# Patient Record
Sex: Male | Born: 2000 | Race: White | Hispanic: Yes | Marital: Single | Smoking: Never smoker
Health system: Southern US, Community
[De-identification: ages and names within clinical notes are randomized; demographics above are authoritative.]

## PROBLEM LIST (undated history)

## (undated) DIAGNOSIS — M419 Scoliosis, unspecified: Secondary | ICD-10-CM

## (undated) DIAGNOSIS — G7100 Muscular dystrophy, unspecified: Secondary | ICD-10-CM

## (undated) DIAGNOSIS — T7840XA Allergy, unspecified, initial encounter: Secondary | ICD-10-CM

## (undated) DIAGNOSIS — G4730 Sleep apnea, unspecified: Secondary | ICD-10-CM

## (undated) DIAGNOSIS — K649 Unspecified hemorrhoids: Secondary | ICD-10-CM

## (undated) DIAGNOSIS — N189 Chronic kidney disease, unspecified: Secondary | ICD-10-CM

## (undated) HISTORY — DX: Chronic kidney disease, unspecified: N18.9

## (undated) HISTORY — DX: Sleep apnea, unspecified: G47.30

## (undated) HISTORY — PX: TONSILLECTOMY: SHX5217

## (undated) HISTORY — DX: Allergy, unspecified, initial encounter: T78.40XA

## (undated) HISTORY — PX: FOOT SURGERY: SHX648

## (undated) HISTORY — DX: Scoliosis, unspecified: M41.9

---
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
## 2023-04-09 NOTE — Progress Notes (Unsigned)
New patient visit   Patient: Albert Young   DOB: [birth year]   22 y.o. Male  MRN: [id number] Visit Date: 04/12/2023  Today's healthcare provider: Ronnald Ramp, MD   No chief complaint on file.  Subjective    Albert Young is a 22 y.o. male who presents today as a new patient to establish care.  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   No family history on file. Social History   Socioeconomic History   Marital status: Single    Spouse name: Not on file   Number of children: Not on file   Years of education: Not on file   Highest education level: Not on file  Occupational History   Not on file  Tobacco Use   Smoking status: Never   Smokeless tobacco: Never  Vaping Use   Vaping Use: Never used  Substance and Sexual Activity   Alcohol use: No   Drug use: No   Sexual activity: Not on file  Other Topics Concern   Not on file  Social History Narrative   Not on file   Social Determinants of Health   Financial Resource Strain: Not on file  Food Insecurity: Not on file  Transportation Needs: Not on file  Physical Activity: Not on file  Stress: Not on file  Social Connections: Not on file    Outpatient Medications Prior to Visit  Medication Sig   cyclobenzaprine (FLEXERIL) 5 MG tablet Take 1 tablet (5 mg total) by mouth 3 (three) times daily as needed for muscle spasms.   ibuprofen (ADVIL,MOTRIN) 600 MG tablet Take 1 tablet (600 mg total) by mouth every 6 (six) hours as needed for mild pain or moderate pain.   ondansetron (ZOFRAN) 4 MG tablet Take 1 tablet (4 mg total) by mouth daily as needed for nausea or vomiting. (Patient not taking: Reported on 04/22/2017)   No facility-administered medications prior to visit.   Allergies  Allergen Reactions   Penicillins Rash     There is no immunization history on file for this patient.  Health Maintenance  Topic Date Due   COVID-19 Vaccine (1) Never done   HPV VACCINES (1 - Male 2-dose series) Never done   HIV Screening  Never done   Hepatitis C Screening  Never done   DTaP/Tdap/Td (1 - Tdap) Never done   INFLUENZA VACCINE  07/29/2023    Patient Care Team: Dorann Lodge, MD as PCP - General (Pediatrics)  Review of Systems  {Labs  Heme  Chem  Endocrine  Serology  Results Review (optional):23779}   Objective    There were no vitals taken for this visit. {Show previous vital signs (optional):23777}  Physical Exam ***  Depression Screen     No data to display         No results found for any visits on 04/12/23.  Assessment & Plan      Problem List Items Addressed This Visit   None    No follow-ups on file.      The entirety of the information documented in the History of Present Illness, Review of Systems and Physical Exam were personally obtained by me. Portions of this information were initially documented by *** . I, Ronnald Ramp, MD have reviewed the documentation above for thoroughness and accuracy.      Ronnald Ramp, MD  [facility] [phone] (phone) [phone] (fax)  [facility]

## 2023-04-12 ENCOUNTER — Encounter: Payer: Self-pay | Admitting: Family Medicine

## 2023-04-12 ENCOUNTER — Ambulatory Visit (INDEPENDENT_AMBULATORY_CARE_PROVIDER_SITE_OTHER): Payer: Medicaid Other | Admitting: Family Medicine

## 2023-04-12 VITALS — BP 121/71 | HR 88 | Resp 16 | Ht 68.0 in

## 2023-04-12 DIAGNOSIS — G4730 Sleep apnea, unspecified: Secondary | ICD-10-CM | POA: Diagnosis not present

## 2023-04-12 DIAGNOSIS — G7101 Duchenne or Becker muscular dystrophy: Secondary | ICD-10-CM | POA: Insufficient documentation

## 2023-04-12 DIAGNOSIS — L700 Acne vulgaris: Secondary | ICD-10-CM | POA: Insufficient documentation

## 2023-04-12 DIAGNOSIS — H547 Unspecified visual loss: Secondary | ICD-10-CM

## 2023-04-12 DIAGNOSIS — Z7689 Persons encountering health services in other specified circumstances: Secondary | ICD-10-CM | POA: Insufficient documentation

## 2023-04-12 DIAGNOSIS — Z973 Presence of spectacles and contact lenses: Secondary | ICD-10-CM

## 2023-04-12 MED ORDER — ADAPALENE 0.1 % EX CREA: TOPICAL_CREAM | Freq: Every day | CUTANEOUS | 2 refills | Status: AC

## 2023-04-12 NOTE — Patient Instructions (Signed)
It was a pleasure meeting you today!  Welcome to [facility].  I look forward to taking part in your care as your new primary care physician.    Summary of our discussion today:   I have prescribed a cream to apply once daily at bed time to the forehead. This medication can cause some drying and redness of the skin so try to avoid being in the sun for long periods of time as this can make it worse A referral has been placed on your behalf for ophthalmology. Our referral coordination team or the office you will be visiting will contact you within the next 2 weeks. If you have not received a phone call within 10 business days please let us know so that we can check into this for you.   Please remember to schedule your annual physical one year from your last physical.   You should return to our clinic in 4 weeks for acne follow up.   Best Wishes,   Dr. Roxan Hockey

## 2023-04-12 NOTE — Assessment & Plan Note (Signed)
Prescribed adalapene to apply once daily at bed time  Counseled to avoid sun exposure and wear a hat when outside, patient voiced understanding

## 2023-04-12 NOTE — Assessment & Plan Note (Addendum)
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

## 2023-04-12 NOTE — Assessment & Plan Note (Signed)
Chronic  Stable  Reports nightly use of BiPap  Followed by pulmonology, Dr. Audrea Muscat

## 2023-04-12 NOTE — Assessment & Plan Note (Signed)
Welcomed patient to [facility]  Reviewed patient's medical history, medications, surgical and social history Discussed roles and expectations for primary care physician-patient relationship Recommended patient schedule annual preventative examinations   

## 2023-05-13 ENCOUNTER — Encounter: Payer: Self-pay | Admitting: Family Medicine

## 2023-05-13 ENCOUNTER — Ambulatory Visit (INDEPENDENT_AMBULATORY_CARE_PROVIDER_SITE_OTHER): Payer: Medicaid Other | Admitting: Family Medicine

## 2023-05-13 VITALS — BP 108/70 | HR 76 | Resp 16

## 2023-05-13 DIAGNOSIS — L700 Acne vulgaris: Secondary | ICD-10-CM | POA: Diagnosis not present

## 2023-05-13 DIAGNOSIS — R059 Cough, unspecified: Secondary | ICD-10-CM | POA: Insufficient documentation

## 2023-05-13 MED ORDER — BENZONATATE 100 MG PO CAPS: 100.0000 mg | ORAL_CAPSULE | Freq: Two times a day (BID) | ORAL | 0 refills | Status: AC | PRN

## 2023-05-13 NOTE — Assessment & Plan Note (Signed)
Acute  Productive cough that is reportedly improving, considered this as SE of lisinopril but less likely given nature of cough as described  Will have patient drink warm team with honey and use PRN tessalon perles 100mg  twice daily  RTC if symptoms are not improved after 10 days, no fever or chills nor SOB reported today

## 2023-05-13 NOTE — Progress Notes (Signed)
I,Albert Young,acting as a scribe for [employer], MD.,have documented all relevant documentation on the behalf of Albert Ramp, MD,as directed by  Albert Ramp, MD while in the presence of Albert Ramp, MD.   Established patient visit   Patient: Albert Young   DOB: [birth year]   22 y.o. Male  MRN: [id number] Visit Date: 05/13/2023  Today's healthcare provider: Ronnald Ramp, MD   Chief Complaint  Patient presents with   Follow-Up Acne   Subjective    HPI   Acne  Patient presents for follow up evaluation of acne. Symptoms have gradually improved.  Acne is primarily located on the forehead. The patient also describes improvement from last office visit with a cream his mom is using. Treatment to date has included RetinA: patient not using . Mother is using Barmicil 1%.   Cough  Reports 1 week of cough denies SOB or chest pain  Reports has become less frquent  Productive of clear sputum  Medications: Outpatient Medications Prior to Visit  Medication Sig   adapalene (DIFFERIN) 0.1 % cream Apply topically at bedtime.   carvedilol (COREG) 3.125 MG tablet Take 3.125 mg by mouth 2 (two) times daily with a meal.   cyclobenzaprine (FLEXERIL) 5 MG tablet Take 1 tablet (5 mg total) by mouth 3 (three) times daily as needed for muscle spasms.   ibuprofen (ADVIL,MOTRIN) 600 MG tablet Take 1 tablet (600 mg total) by mouth every 6 (six) hours as needed for mild pain or moderate pain.   lisinopril (ZESTRIL) 10 MG tablet Take 10 mg by mouth daily.   ondansetron (ZOFRAN) 4 MG tablet Take 1 tablet (4 mg total) by mouth daily as needed for nausea or vomiting.   Vitamin D, Ergocalciferol, (DRISDOL) 1.25 MG (50000 UNIT) CAPS capsule Take 50,000 Units by mouth every 7 (seven) days.   No facility-administered medications prior to visit.    Review of Systems      Objective    BP 108/70 (BP Location: Right Arm, Patient  Position: Sitting, Cuff Size: Normal)   Pulse 76   Resp 16     Physical Exam Vitals reviewed.  Constitutional:      General: He is not in acute distress.    Appearance: Normal appearance. He is not ill-appearing, toxic-appearing or diaphoretic.     Comments: Well appearing male seated in electric wheelchair, in NAD on RA  Eyes:     Conjunctiva/sclera: Conjunctivae normal.  Cardiovascular:     Rate and Rhythm: Normal rate and regular rhythm.     Pulses: Normal pulses.     Heart sounds: Normal heart sounds. No murmur heard.    No friction rub. No gallop.  Pulmonary:     Effort: Pulmonary effort is normal. No accessory muscle usage, prolonged expiration or respiratory distress.     Breath sounds: Normal breath sounds. No stridor, decreased air movement or transmitted upper airway sounds. No decreased breath sounds, wheezing, rhonchi or rales.  Abdominal:     General: Bowel sounds are normal. There is no distension.     Palpations: Abdomen is soft.     Tenderness: There is no abdominal tenderness.  Musculoskeletal:     Right lower leg: No edema.     Left lower leg: No edema.     Comments: Bilateral hand contractures   Skin:    Findings: No erythema or rash.  Neurological:     Mental Status: He is alert and oriented to person, place, and time.  No results found for any visits on 05/13/23.  Assessment & Plan     Problem List Items Addressed This Visit       Musculoskeletal and Integument   Acne vulgaris    Chronic  Improved per patient  Recommended against daily use of steroid creams on the face to prevent hypopigmentation over time, both patient and his mother voiced understanding  Recommended starting adapalene 1% use at bedtime and wearing a hat when outside to protect against hypersensitivity to the sun         Other   Cough in adult - Primary    Acute  Productive cough that is reportedly improving, considered this as SE of lisinopril but less likely given  nature of cough as described  Will have patient drink warm team with honey and use PRN tessalon perles 100mg  twice daily  RTC if symptoms are not improved after 10 days, no fever or chills nor SOB reported today        Relevant Medications   benzonatate (TESSALON) 100 MG capsule     Return in about 4 months (around 09/13/2023) for CPE.        The entirety of the information documented in the History of Present Illness, Review of Systems and Physical Exam were personally obtained by me. Portions of this information were initially documented by Hetty Ely, CMA . I, Albert Ramp, MD have reviewed the documentation above for thoroughness and accuracy.      Albert Ramp, MD  [facility] [phone] (phone) [phone] (fax)  [facility]

## 2023-05-13 NOTE — Patient Instructions (Addendum)
   [facility] [place] 05/26/2023 01:20 PM Dr. Viviann Spare Dingeldein   Please use the topical therapy previously prescribed daily at bed time and make sure to wear a hat for sun protection when you will be outside on sunny days    I have prescribed a medication to use up to twice daily as needed for cough   Please drink warm teas with honey to help with your cough as well   Please return to care if symptoms are not improved after 10 days    Please follow up with me in 4 months for a physical

## 2023-05-13 NOTE — Assessment & Plan Note (Signed)
Chronic  Improved per patient  Recommended against daily use of steroid creams on the face to prevent hypopigmentation over time, both patient and his mother voiced understanding  Recommended starting adapalene 1% use at bedtime and wearing a hat when outside to protect against hypersensitivity to the sun

## 2023-09-13 ENCOUNTER — Ambulatory Visit: Payer: Medicaid Other | Admitting: Family Medicine

## 2023-10-07 ENCOUNTER — Ambulatory Visit: Payer: Medicaid Other | Admitting: Family Medicine

## 2024-06-22 ENCOUNTER — Telehealth: Payer: Self-pay

## 2024-06-22 DIAGNOSIS — G7101 Duchenne or Becker muscular dystrophy: Secondary | ICD-10-CM

## 2024-06-22 NOTE — Telephone Encounter (Signed)
 Order has been updated.

## 2024-06-22 NOTE — Telephone Encounter (Signed)
 Copied from CRM [id number]. Topic: General - Other >> Jun 22, 2024 11:37 AM Avram MATSU wrote: Reason for CRM: Debby Arloa is calling from [facility] because patient is too old for them to start seeing him and he needs to be referred to adult cardiology.

## 2024-08-22 ENCOUNTER — Ambulatory Visit: Payer: Self-pay

## 2024-08-22 ENCOUNTER — Telehealth: Payer: Self-pay | Admitting: Family Medicine

## 2024-08-22 NOTE — Telephone Encounter (Signed)
 FYI Only or Action Required?: FYI only for provider.  Patient was last seen in primary care on 05/13/2023 by Sharma Coyer, MD.  Called Nurse Triage reporting Advice Only.   Triage Disposition: Information or Advice Only Call  Patient/caregiver understands and will follow disposition?: Yes  Patient's mother called asking about a mask that was given to him at the ER . She says it will not inflate properly.  So he can't use it and she says it gets the phlegm out   CB#  [phone]    Reason for Disposition  Health information question, no triage required and triager able to answer question  Answer Assessment - Initial Assessment Questions 1. REASON FOR CALL: What is the main reason for your call? or How can I best help you?     Mom is calling about a cough assistance device that was received from the pulmonary office. Mom states the device isn't working correctly. Recommended to call pulmonary office to get assistance with the device. Mother verbalized understanding and all questions answered.  Protocols used: Information Only Call - No Triage-A-AH

## 2024-08-22 NOTE — ED Notes (Signed)
 [facility] Emergency Department Attending Attestation Note   ED Clinical Impression   Final diagnoses:  Dizziness (Primary)  Hemorrhoids, unspecified hemorrhoid type  Chest pain, unspecified type  Duchenne muscular dystrophy         ED Attending Physician Teaching Attestation  MDM/ED course:  I supervised care provided by the resident. We have discussed the case, I have reviewed the note and I agree with the plan of treatment with exceptions as documented in my note below. I have personally performed a face-to-face diagnostic evaluation on this patient.   ED Attending Note   ED Triage Vitals  Enc Vitals Group     BP 08/20/24 1558 120/87     Pulse 08/20/24 1556 95     SpO2 Pulse 08/20/24 1900 99     Resp 08/20/24 1558 14     Temp 08/20/24 1558 36.8 C (98.2 F)     Temp Source 08/20/24 2012 Oral     SpO2 08/20/24 1556 95 %     Weight --      Height --      Head Circumference --      Peak Flow --      Pain Score --      Pain Loc --      Pain Education --      Exclude from Growth Chart --      Imaging reviewed by me:  XR Chest 2 views  Final Result    No acute cardiopulmonary abnormalities.             Dispo:  The patient was discharged home in stable condition. The patient will follow up with his primary provider.   See chart and resident provider documentation for details.  Additional Medical Decision Making  I have reviewed the vital signs and the nursing notes. Labs and radiology results that were available during my care of the patient were independently reviewed by me and considered in my medical decision making.   I reviewed the patient's prior medical records. I independently visualized the EKG tracing  I independently visualized the radiology images.  I supervised the following procedure performed by the resident: I was available throughout the procedure and I was present for the key portions of the procedure.

## 2024-08-22 NOTE — Telephone Encounter (Unsigned)
 Copied from CRM [id number]. Topic: Clinical - Pink Word Triage >> Aug 22, 2024  2:44 PM Leonette P wrote: Patient's mother called asking about a mask that was given to him at the ER . She says it will not inflate properly.  So he can't use it and she says it gets the phlegm out  CB#  [phone]

## 2024-08-23 NOTE — Telephone Encounter (Signed)
 This may need to be addressed with [facility] pulm specialist.

## 2024-08-23 NOTE — Telephone Encounter (Signed)
 Contact patient using language line [phone]) agent Lidani (id [id number]) assisted with phone call. Patient mother was advised and verbalized understanding

## 2024-08-24 ENCOUNTER — Encounter: Payer: Self-pay | Admitting: Physician Assistant

## 2024-08-24 ENCOUNTER — Ambulatory Visit (INDEPENDENT_AMBULATORY_CARE_PROVIDER_SITE_OTHER): Admitting: Physician Assistant

## 2024-08-24 VITALS — BP 118/67 | HR 124 | Ht 68.0 in | Wt 170.0 lb

## 2024-08-24 DIAGNOSIS — R000 Tachycardia, unspecified: Secondary | ICD-10-CM | POA: Diagnosis not present

## 2024-08-24 DIAGNOSIS — R630 Anorexia: Secondary | ICD-10-CM | POA: Diagnosis not present

## 2024-08-24 DIAGNOSIS — G7101 Duchenne or Becker muscular dystrophy: Secondary | ICD-10-CM

## 2024-08-24 DIAGNOSIS — R0789 Other chest pain: Secondary | ICD-10-CM | POA: Diagnosis not present

## 2024-08-24 DIAGNOSIS — R42 Dizziness and giddiness: Secondary | ICD-10-CM

## 2024-08-24 NOTE — Progress Notes (Signed)
 Established patient visit  Patient: Albert Young   DOB: September 25, 2001   23 y.o. Male  MRN: [id number] Visit Date: 08/24/2024  Today's healthcare provider: Jolynn Spencer, PA-C   Chief Complaint  Patient presents with   Follow-up    ER follow up from 8/24. Seen for dizziness Patient reports that when he is sitting to shower or use the restroom he feels dizzy, has headache. States he will sometimes have chest heaviness and some SOB.  States that mom will elevate his legs and it seems to help.    Subjective     HPI     Follow-up    Additional comments: ER follow up from 8/24. Seen for dizziness Patient reports that when he is sitting to shower or use the restroom he feels dizzy, has headache. States he will sometimes have chest heaviness and some SOB.  States that mom will elevate his legs and it seems to help.       Last edited by Cherry Chiquita HERO, CMA on 08/24/2024  3:19 PM.       Discussed the use of AI scribe software for clinical note transcription with the patient, who gave verbal consent to proceed.  History of Present Illness Albert Young is a 23 year old male who presents with dizziness and chest pressure.  Dizziness occurs primarily when showering, sitting in a chair, or using the bathroom, starting five days ago. It is accompanied by a headache and chest pressure, causing breathing difficulty. He feels fine in his wheelchair. Chest pressure is most pronounced in the morning when sitting in his bath chair and is relieved by elevating his legs.  He was seen in the emergency room where chest x-ray and EKG were normal. He was advised to drink plenty of water but reports no improvement. He has a history of fainting, which prompted the emergency room visit.  He has a poor appetite and was previously on medication for kidney stones, which could cause low blood pressure, though his blood pressure was normal in the emergency room.  No urinary tract infection, issues  with urination, weakness, tingling, numbness, dizziness with position changes, or current chest pain or shortness of breath.  Dizziness (Primary)  Hemorrhoids, unspecified hemorrhoid type  Chest pain, unspecified type  Duchenne muscular dystrophy  Albert Young was seen today in the emergency department for evaluation of chest pressure, headache, dizziness and hemorrhoids. His workup here was reassuring, he continues to have blood in his urine from his kidney stones. His chest x-ray did not look like there was any infection. His heart enzyme was normal, which is a good thing. As we discussed please call your primary care provider tomorrow to make an appointment for close follow-up. If his symptoms come back or get worse or he passes out or the chest pain gets bad and does not go away or he starts having a lot of bleeding from his rectum or any other concerning symptom please come back to the emergency department immediately. Otherwise I will prescribe you cream for the hemorrhoids.  Also keep well hydrated!   It was so great to meet you, and happy birthd     08/24/2024    3:20 PM 04/12/2023    1:31 PM 04/12/2023    1:30 PM  Depression screen PHQ 2/9  Decreased Interest 0 0 0  Down, Depressed, Hopeless 1 0 0  PHQ - 2 Score 1 0 0  Altered sleeping 0 0   Tired, decreased energy 1 0  Change in appetite 3 0   Feeling bad or failure about yourself  1 0   Trouble concentrating 0 0   Moving slowly or fidgety/restless 1 0   Suicidal thoughts 0 0   PHQ-9 Score 7 0   Difficult doing work/chores Somewhat difficult Not difficult at all       08/24/2024    3:20 PM  GAD 7 : Generalized Anxiety Score  Nervous, Anxious, on Edge 1  Control/stop worrying 1  Worry too much - different things 2  Trouble relaxing 1  Restless 1  Easily annoyed or irritable 1  Afraid - awful might happen 1  Total GAD 7 Score 8  Anxiety Difficulty Somewhat difficult    Medications: Outpatient Medications Prior to Visit   Medication Sig   adapalene  (DIFFERIN ) 0.1 % cream Apply topically at bedtime.   benzonatate  (TESSALON ) 100 MG capsule Take 1 capsule (100 mg total) by mouth 2 (two) times daily as needed for cough.   carvedilol (COREG) 3.125 MG tablet Take 3.125 mg by mouth 2 (two) times daily with a meal.   cyclobenzaprine  (FLEXERIL ) 5 MG tablet Take 1 tablet (5 mg total) by mouth 3 (three) times daily as needed for muscle spasms.   ibuprofen  (ADVIL ,MOTRIN ) 600 MG tablet Take 1 tablet (600 mg total) by mouth every 6 (six) hours as needed for mild pain or moderate pain.   lisinopril (ZESTRIL) 10 MG tablet Take 10 mg by mouth daily.   ondansetron  (ZOFRAN ) 4 MG tablet Take 1 tablet (4 mg total) by mouth daily as needed for nausea or vomiting.   Vitamin D, Ergocalciferol, (DRISDOL) 1.25 MG (50000 UNIT) CAPS capsule Take 50,000 Units by mouth every 7 (seven) days.   No facility-administered medications prior to visit.    Review of Systems  All other systems reviewed and are negative.  All negative Except see HPI       Objective    BP 118/67 (BP Location: Right Arm, Patient Position: Sitting, Cuff Size: Normal)   Pulse (!) 124   Ht 5' 8 (1.727 m)   Wt 170 lb (77.1 kg)   SpO2 100%   BMI 25.85 kg/m     Physical Exam Vitals reviewed.  Constitutional:      General: He is not in acute distress.    Appearance: Normal appearance. He is not diaphoretic.  HENT:     Head: Normocephalic and atraumatic.  Eyes:     General: No scleral icterus.    Conjunctiva/sclera: Conjunctivae normal.  Cardiovascular:     Rate and Rhythm: Normal rate and regular rhythm.     Pulses: Normal pulses.     Heart sounds: Normal heart sounds. No murmur heard. Pulmonary:     Effort: Pulmonary effort is normal. No respiratory distress.     Breath sounds: Normal breath sounds. No wheezing or rhonchi.  Musculoskeletal:     Cervical back: Neck supple.     Right lower leg: No edema.     Left lower leg: No edema.   Lymphadenopathy:     Cervical: No cervical adenopathy.  Skin:    General: Skin is warm and dry.     Findings: No rash.  Neurological:     Mental Status: He is alert and oriented to person, place, and time. Mental status is at baseline.  Psychiatric:        Mood and Affect: Mood normal.        Behavior: Behavior normal.      No results  found for any visits on 08/24/24.      Assessment & Plan Dizziness associated with postural changes Dizziness with postural changes, likely due to orthostatic hypotension. Previous evaluations normal. Reviewed labwork from 08/20/24, 07/30/24 - Perform orthostatic blood pressure measurements, see vitals. - Refer to cardiologist.  Tachycardia Elevated heart rate at 124 bpm, possibly related to postural changes or cardiac issues. Previous evaluations normal. - Recheck heart rate. - Perform EKG if heart rate remains elevated. - Refer to cardiologist. Will follow-up  Chest pressure with exertion Chest pressure during exertion, absent at rest. Previous evaluations normal. EKG performed - Refer to cardiologist.  Poor appetite Reports poor appetite with no identified cause. - Encourage adequate hydration and nutrition.  Dizziness (Primary)  - EKG 12-Lead  Tachycardia  - EKG 12-Lead  Chest pressure  - EKG 12-Lead  Muscular dystrophy, Duchenne (HCC) Chronic and stable Managed by neurology  No orders of the defined types were placed in this encounter.   No follow-ups on file.   The patient was advised to call back or seek an in-person evaluation if the symptoms worsen or if the condition fails to improve as anticipated.  I discussed the assessment and treatment plan with the patient. The patient was provided an opportunity to ask questions and all were answered. The patient agreed with the plan and demonstrated an understanding of the instructions.  I, Laveyah Oriol, PA-C have reviewed all documentation for this visit. The  documentation on 08/24/2024  for the exam, diagnosis, procedures, and orders are all accurate and complete.  Jolynn Spencer, [facility], MMS [facility] [phone] (phone) [phone] (fax)  [facility]

## 2024-08-25 NOTE — Telephone Encounter (Signed)
-----   Message from Comer Shearer, MD sent at 08/24/2024  3:47 PM EDT ----- Regarding: RE: [facility] patient - assistance with DME equipment (and getting plugged back in for follow up) Thanks Asberry! I'm also in process of getting him transitioned to the adult PM&R clinic - I'll email you the form   Best, Alex ----- Message ----- From: Stevens Asberry Norris, MD Sent: 08/24/2024   1:44 PM EDT To: Comer Lorane Shearer, MD; Anahit Che# Subject: RE: [facility] patient - assistance with DME equ#  Marolyn - I can sign DME paperwork if needed before he comes back for return visit.  Neuro schedulers - please schedule this patient for return visit in interdisciplinary [facility] clinic with Dr. Gregg Maeola Asberry ----- Message ----- From: Shearer Comer Lorane, MD Sent: 08/23/2024   5:16 PM EDT To: Asberry Norris Stevens, MD Subject: [facility] patient - assistance with DME equipme#  Hi Asberry -  We received some wheelchair DME equipment paperwork in the pulmonary clinic where we follow Albert Young - and wanted to see if you'd be willing to review and sign it on his behalf as we're not really familiar with the wheelchair/DME needs on our end. I'd be happy to securely email it to you or a nurse/care coordinator.   I also see he's overdue to get back in with neuro clinic - do you all need a new referral for him or is there any way I can facilitate that? I know it's sometimes hard for mom to navigate all of the phone trees and getting in touch with all of the different clinics between [facility] and his siblings' sub specialists  Thanks so much for your help in this matter!  Best, Marolyn Shearer

## 2024-08-29 DIAGNOSIS — R000 Tachycardia, unspecified: Secondary | ICD-10-CM | POA: Insufficient documentation

## 2024-08-29 DIAGNOSIS — R630 Anorexia: Secondary | ICD-10-CM | POA: Insufficient documentation

## 2024-08-29 DIAGNOSIS — R0789 Other chest pain: Secondary | ICD-10-CM | POA: Insufficient documentation

## 2024-08-29 DIAGNOSIS — R42 Dizziness and giddiness: Secondary | ICD-10-CM | POA: Insufficient documentation

## 2024-10-19 ENCOUNTER — Encounter: Admitting: Family Medicine

## 2024-10-24 ENCOUNTER — Ambulatory Visit (INDEPENDENT_AMBULATORY_CARE_PROVIDER_SITE_OTHER): Admitting: Family Medicine

## 2024-10-24 ENCOUNTER — Encounter: Payer: Self-pay | Admitting: Family Medicine

## 2024-10-24 VITALS — BP 97/66 | HR 92 | Temp 98.4°F | Ht 68.0 in | Wt 170.0 lb

## 2024-10-24 DIAGNOSIS — R630 Anorexia: Secondary | ICD-10-CM

## 2024-10-24 DIAGNOSIS — R42 Dizziness and giddiness: Secondary | ICD-10-CM | POA: Diagnosis not present

## 2024-10-24 DIAGNOSIS — K625 Hemorrhage of anus and rectum: Secondary | ICD-10-CM | POA: Diagnosis not present

## 2024-10-24 DIAGNOSIS — G7101 Duchenne or Becker muscular dystrophy: Secondary | ICD-10-CM

## 2024-10-24 DIAGNOSIS — Z131 Encounter for screening for diabetes mellitus: Secondary | ICD-10-CM

## 2024-10-24 DIAGNOSIS — R519 Headache, unspecified: Secondary | ICD-10-CM | POA: Diagnosis not present

## 2024-10-24 MED ORDER — MIRTAZAPINE 7.5 MG PO TABS: 7.5000 mg | ORAL_TABLET | Freq: Every day | ORAL | 3 refills | Status: AC

## 2024-10-24 MED ORDER — SUMATRIPTAN SUCCINATE 25 MG PO TABS: 25.0000 mg | ORAL_TABLET | ORAL | 0 refills | Status: AC | PRN

## 2024-10-24 NOTE — Progress Notes (Signed)
 Established patient visit   Patient: Albert Young   DOB: [birth year]   23 y.o. Male  MRN: [id number] Visit Date: 10/24/2024  Today's healthcare provider: Rockie Agent, MD   Chief Complaint  Patient presents with   Dizziness    Patient reports chest pressure, dizziness, headache in the morning time and when moving to shower chair    Hemorrhoids    Patient reports that he has some hemorrhoids and is noticing blood when wiping, no pain but is concerned with internal hemorrhoids   Subjective     HPI     Dizziness    Additional comments: Patient reports chest pressure, dizziness, headache in the morning time and when moving to shower chair         Hemorrhoids    Additional comments: Patient reports that he has some hemorrhoids and is noticing blood when wiping, no pain but is concerned with internal hemorrhoids      Last edited by Cherry Chiquita HERO, CMA on 10/24/2024 11:46 AM.       Discussed the use of AI scribe software for clinical note transcription with the patient, who gave verbal consent to proceed.  History of Present Illness Albert Young is a 23 year old male with muscular dystrophy who presents with chest pressure, dizziness, and headaches.  He experiences chest pressure, dizziness, and headaches primarily in the morning and when moving to his shower chair. The headaches are described as throbbing and located in the forehead, middle, and side, feeling like 'something like, like pushing me, like, like grabbing it.' These symptoms occur daily and are associated with a feeling of difficulty breathing. The symptoms subside when he lies down. He has been evaluated by cardiology, with a recent echocardiogram in September 2025. He reports that the cardiologist told him everything was normal. He is currently on a new medication, 25 mg, but cannot recall the name. He also takes carvedilol 3.125 mg.  He notices blood when wiping, which he  attributes to hemorrhoids. The bleeding is described as 'like pee droplets' and occurs when he uses the bathroom. He is concerned about internal hemorrhoids but does not experience pain. The bleeding occurs when he uses the bathroom.  He mentions a decreased appetite and has been trying to supplement with Ensure. He does not feel hungry and does not have difficulty swallowing. He also experiences chills and cold hands, which he attributes to poor circulation. He denies any issues with eating once he starts.  His blood pressure was recorded at 97/66 today, which he notes is lower than his usual morning reading of 132.     Past Medical History:  Diagnosis Date   Allergy    Chronic kidney disease    Closed fracture of right distal femur 07/27/2015   Hemorrhoids    Muscular dystrophy (HCC)    Scoliosis    Sleep apnea     Medications: Outpatient Medications Prior to Visit  Medication Sig   adapalene  (DIFFERIN ) 0.1 % cream Apply topically at bedtime.   benzonatate  (TESSALON ) 100 MG capsule Take 1 capsule (100 mg total) by mouth 2 (two) times daily as needed for cough.   carvedilol (COREG) 3.125 MG tablet Take 3.125 mg by mouth 2 (two) times daily with a meal.   cyclobenzaprine  (FLEXERIL ) 5 MG tablet Take 1 tablet (5 mg total) by mouth 3 (three) times daily as needed for muscle spasms.   eplerenone (INSPRA) 25 MG tablet Take 25 mg by mouth daily.  ibuprofen  (ADVIL ,MOTRIN ) 600 MG tablet Take 1 tablet (600 mg total) by mouth every 6 (six) hours as needed for mild pain or moderate pain.   lisinopril (ZESTRIL) 10 MG tablet Take 10 mg by mouth daily.   ondansetron  (ZOFRAN ) 4 MG tablet Take 1 tablet (4 mg total) by mouth daily as needed for nausea or vomiting.   Vitamin D, Ergocalciferol, (DRISDOL) 1.25 MG (50000 UNIT) CAPS capsule Take 50,000 Units by mouth every 7 (seven) days.   No facility-administered medications prior to visit.    Review of Systems  Last CBC Lab Results  Component  Value Date   WBC 5.0 01/01/2021   HGB 15.1 01/01/2021   HCT 47.1 01/01/2021   MCV 89.4 01/01/2021   MCH 28.7 01/01/2021   RDW 13.1 01/01/2021   PLT 277 01/01/2021   Last metabolic panel Lab Results  Component Value Date   GLUCOSE 108 (H) 01/01/2021   NA 139 01/01/2021   K 3.8 01/01/2021   CL 99 01/01/2021   CO2 25 01/01/2021   BUN 10 01/01/2021   CREATININE <0.30 (L) 01/01/2021   GFRNONAA NOT CALCULATED 01/01/2021   CALCIUM 9.9 01/01/2021   PROT 8.6 (H) 01/01/2021   ALBUMIN 4.8 01/01/2021   BILITOT 1.6 (H) 01/01/2021   ALKPHOS 52 01/01/2021   AST 44 (H) 01/01/2021   ALT 36 01/01/2021   ANIONGAP 15 01/01/2021   Last lipids No results found for: CHOL, HDL, LDLCALC, LDLDIRECT, TRIG, CHOLHDL Last hemoglobin A1c No results found for: HGBA1C Last thyroid functions No results found for: TSH, T3TOTAL, T4TOTAL, FREET4, THYROIDAB Last vitamin D No results found for: 25OHVITD2, 25OHVITD3, VD25OH Last vitamin B12 and Folate No results found for: VITAMINB12, FOLATE      Objective    BP 97/66 (BP Location: Right Arm, Patient Position: Sitting, Cuff Size: Normal)   Pulse 92   Temp 98.4 F (36.9 C) (Oral)   Ht 5' 8 (1.727 m)   Wt 170 lb (77.1 kg)   SpO2 99%   BMI 25.85 kg/m   BP Readings from Last 3 Encounters:  10/24/24 97/66  08/24/24 118/67  05/13/23 108/70   Wt Readings from Last 3 Encounters:  10/24/24 170 lb (77.1 kg)  08/24/24 170 lb (77.1 kg)  01/01/21 170 lb (77.1 kg) (82%, Z= 0.91)*   * Growth percentiles are based on CDC (Boys, 2-20 Years) data.        Physical Exam Vitals reviewed.  Constitutional:      General: He is not in acute distress.    Appearance: Normal appearance. He is not diaphoretic.  HENT:     Head: Normocephalic and atraumatic.  Eyes:     General: No scleral icterus.    Conjunctiva/sclera: Conjunctivae normal.  Cardiovascular:     Rate and Rhythm: Normal rate and regular rhythm.     Pulses:  Normal pulses.     Heart sounds: Normal heart sounds. No murmur heard. Pulmonary:     Effort: Pulmonary effort is normal. No respiratory distress.     Breath sounds: Normal breath sounds. No wheezing or rhonchi.  Musculoskeletal:     Cervical back: Neck supple.     Right lower leg: No edema.     Left lower leg: No edema.  Lymphadenopathy:     Cervical: No cervical adenopathy.  Skin:    General: Skin is warm and dry.     Findings: No rash.  Neurological:     Mental Status: He is alert and oriented to person, place,  and time. Mental status is at baseline.  Psychiatric:        Mood and Affect: Mood normal.        Behavior: Behavior normal.       No results found for any visits on 10/24/24.  Assessment & Plan     Problem List Items Addressed This Visit     Dizziness - Primary   Relevant Orders   Fe+TIBC+Fer   Muscular dystrophy, Duchenne (HCC)   Other Visit Diagnoses       Daily headache       Relevant Medications   SUMAtriptan (IMITREX) 25 MG tablet   mirtazapine (REMERON) 7.5 MG tablet   Other Relevant Orders   CBC w/Diff/Platelet   Comprehensive Metabolic Panel (CMET)   TSH + free T4     Bright red rectal bleeding       Relevant Orders   CBC w/Diff/Platelet   Ambulatory referral to Gastroenterology   Fe+TIBC+Fer     Screening for diabetes mellitus       Relevant Orders   Hemoglobin A1c     Decreased appetite       Relevant Medications   mirtazapine (REMERON) 7.5 MG tablet        Assessment & Plan Headache with associated dizziness and chest pressure Chronic headaches with associated dizziness and chest pressure, primarily occurring when sitting in a side chair. Blood pressure low at 97/66 mmHg, normal at 132/80 mmHg in the morning. Headaches are throbbing, located in the forehead, with associated nausea. Differential includes migraine headaches, tension headaches, and potential dysautonomia. No prior CT or MRI of the head. - Order CBC, CMP, TSH, T4, and  A1c to evaluate for underlying causes such as anemia, thyroid dysfunction, or diabetes. - Prescribe Imitrex 25 mg to be taken at the onset of headache, with a second dose two hours later if needed, not exceeding two doses per day. - Refer to neurology for further evaluation of headaches and associated symptoms. - Discuss with pulmonologist regarding chest pressure and potential respiratory involvement.  Internal hemorrhoids with bleeding Intermittent bright red bleeding during bowel movements, suggestive of internal hemorrhoids. Bleeding similar to droplets. No pain reported. - Confirm gastroenterology appointment on December 13, 2024, with Dr. Charlanne for evaluation of internal hemorrhoids.  Poor appetite Reported low appetite with no difficulty swallowing or eating. No issues with digestion or absorption. - Prescribe mirtazapine 7.5 mg at bedtime to stimulate appetite and aid with sleep.     No follow-ups on file.         Rockie Agent, MD  [facility] [phone] (phone) [phone] (fax)  [facility]

## 2024-10-24 NOTE — Patient Instructions (Addendum)
 [facility] for labs  [address]  [facility]   Neurology for headaches  [facility] [address] [place]  [address]  [address]  [place], [place] [address]  [phone]    Pulmonology - to discuss chest tightness  Phone: [phone]    To keep you healthy, please keep in mind the following health maintenance items that you are due for:   There are no preventive care reminders to display for this patient.   Best Wishes,   Dr. Lang

## 2024-10-25 LAB — CBC WITH DIFFERENTIAL/PLATELET
Basophils Absolute: 0 x10E3/uL (ref 0.0–0.2)
Basos: 1 %
EOS (ABSOLUTE): 0.1 x10E3/uL (ref 0.0–0.4)
Eos: 2 %
Hematocrit: 35.2 % — ABNORMAL LOW (ref 37.5–51.0)
Hemoglobin: 11.7 g/dL — ABNORMAL LOW (ref 13.0–17.7)
Immature Grans (Abs): 0 x10E3/uL (ref 0.0–0.1)
Immature Granulocytes: 0 %
Lymphocytes Absolute: 1.1 x10E3/uL (ref 0.7–3.1)
Lymphs: 32 %
MCH: 29.4 pg (ref 26.6–33.0)
MCHC: 33.2 g/dL (ref 31.5–35.7)
MCV: 88 fL (ref 79–97)
Monocytes Absolute: 0.3 x10E3/uL (ref 0.1–0.9)
Monocytes: 8 %
Neutrophils Absolute: 2.1 x10E3/uL (ref 1.4–7.0)
Neutrophils: 57 %
Platelets: 237 x10E3/uL (ref 150–450)
RBC: 3.98 x10E6/uL — ABNORMAL LOW (ref 4.14–5.80)
RDW: 13.2 % (ref 11.6–15.4)
WBC: 3.6 x10E3/uL (ref 3.4–10.8)

## 2024-10-25 LAB — HEMOGLOBIN A1C
Est. average glucose Bld gHb Est-mCnc: 103 mg/dL
Hgb A1c MFr Bld: 5.2 % (ref 4.8–5.6)

## 2024-10-25 LAB — COMPREHENSIVE METABOLIC PANEL WITH GFR
ALT: 20 IU/L (ref 0–44)
AST: 27 IU/L (ref 0–40)
Albumin: 4.9 g/dL (ref 4.3–5.2)
Alkaline Phosphatase: 64 IU/L (ref 47–123)
BUN/Creatinine Ratio: 90 — ABNORMAL HIGH (ref 9–20)
BUN: 9 mg/dL (ref 6–20)
Bilirubin Total: 2.2 mg/dL — ABNORMAL HIGH (ref 0.0–1.2)
CO2: 25 mmol/L (ref 20–29)
Calcium: 9.5 mg/dL (ref 8.7–10.2)
Chloride: 95 mmol/L — ABNORMAL LOW (ref 96–106)
Creatinine, Ser: 0.1 mg/dL — ABNORMAL LOW (ref 0.76–1.27)
Globulin, Total: 2.5 g/dL (ref 1.5–4.5)
Glucose: 89 mg/dL (ref 70–99)
Potassium: 5 mmol/L (ref 3.5–5.2)
Sodium: 135 mmol/L (ref 134–144)
Total Protein: 7.4 g/dL (ref 6.0–8.5)
eGFR: 242 mL/min/1.73 (ref >59)

## 2024-10-25 LAB — TSH+FREE T4
Free T4: 1.71 ng/dL (ref 0.82–1.77)
TSH: 1.43 u[IU]/mL (ref 0.450–4.500)

## 2024-10-25 LAB — IRON,TIBC AND FERRITIN PANEL
Ferritin: 254 ng/mL (ref 30–400)
Iron Saturation: 29 % (ref 15–55)
Iron: 105 ug/dL (ref 38–169)
Total Iron Binding Capacity: 362 ug/dL (ref 250–450)
UIBC: 257 ug/dL (ref 111–343)

## 2024-10-26 ENCOUNTER — Telehealth: Payer: Self-pay

## 2024-10-26 DIAGNOSIS — R000 Tachycardia, unspecified: Secondary | ICD-10-CM

## 2024-10-26 DIAGNOSIS — R0789 Other chest pain: Secondary | ICD-10-CM

## 2024-10-26 DIAGNOSIS — G7101 Duchenne or Becker muscular dystrophy: Secondary | ICD-10-CM

## 2024-10-26 DIAGNOSIS — R42 Dizziness and giddiness: Secondary | ICD-10-CM

## 2024-10-26 NOTE — Telephone Encounter (Unsigned)
 Copied from CRM #[id number]. Topic: Referral - Request for Referral >> Oct 26, 2024 11:44 AM Nathanel BROCKS wrote: Did the patient discuss referral with their provider in the last year? No (If No - schedule appointment) (If Yes - send message)  Appointment offered? No  Type of order/referral and detailed reason for visit: Pt is wanting a referral to a cardiologist   Preference of office, provider, location: [facility]  Dr Glendale Guile [phone]  If referral order, have you been seen by this specialty before? Yes (If Yes, this issue or another issue? When? Where? September 2025  Can we respond through MyChart? Yes

## 2024-10-26 NOTE — Telephone Encounter (Unsigned)
 Copied from CRM #[id number]. Topic: Clinical - Lab/Test Results >> Oct 26, 2024 12:03 PM Amber H wrote: Reason for CRM: Patient called and wanted to know if his lab test results were in. I did advise to him that the results are in however, the doctor has to look at them and interpret them first. Requesting call back when doctor looks over it.      Requesting that we speak to him mom Leita- [phone] (needs spanish interpreter)

## 2024-10-27 ENCOUNTER — Ambulatory Visit: Payer: Self-pay | Admitting: Family Medicine

## 2024-10-27 DIAGNOSIS — R17 Unspecified jaundice: Secondary | ICD-10-CM

## 2024-10-27 NOTE — Telephone Encounter (Signed)
 Patient notified

## 2024-10-27 NOTE — Telephone Encounter (Signed)
 See results encounter, will retry patient at a later time

## 2024-10-27 NOTE — Telephone Encounter (Signed)
 Referral has been submitted

## 2025-01-24 ENCOUNTER — Ambulatory Visit: Admitting: Family Medicine

## 2025-02-07 ENCOUNTER — Ambulatory Visit
